# Patient Record
Sex: Female | Born: 1985 | Race: White | Hispanic: Yes
Health system: Southern US, Community
[De-identification: ages and names within clinical notes are randomized; demographics above are authoritative.]

---

## 2012-03-01 ENCOUNTER — Encounter: Payer: Self-pay | Admitting: Maternal and Fetal Medicine

## 2012-03-26 ENCOUNTER — Encounter: Payer: Self-pay | Admitting: Obstetrics and Gynecology

## 2012-06-04 ENCOUNTER — Encounter: Payer: Self-pay | Admitting: Maternal & Fetal Medicine

## 2012-08-06 ENCOUNTER — Encounter: Payer: Self-pay | Admitting: Obstetrics and Gynecology

## 2012-08-12 ENCOUNTER — Inpatient Hospital Stay: Payer: Self-pay

## 2012-08-12 LAB — CBC WITH DIFFERENTIAL/PLATELET
Basophil #: 0 10*3/uL (ref 0.0–0.1)
Basophil %: 0.3 %
Eosinophil #: 0 10*3/uL (ref 0.0–0.7)
Eosinophil %: 0.5 %
HGB: 12.6 g/dL (ref 12.0–16.0)
Lymphocyte %: 16.6 %
MCH: 32.4 pg (ref 26.0–34.0)
MCV: 92 fL (ref 80–100)
Monocyte #: 0.8 x10 3/mm (ref 0.2–0.9)
Neutrophil %: 74.2 %
Platelet: 176 10*3/uL (ref 150–440)
RBC: 3.9 10*6/uL (ref 3.80–5.20)
RDW: 13.6 % (ref 11.5–14.5)
WBC: 8.9 10*3/uL (ref 3.6–11.0)

## 2014-04-14 IMAGING — US US OB DETAIL+14 WK - NRPT [facility]
1 series · 14 of 28 positions shown · non-contrast
Comparison: none

[Series 1: us ob detail+14 wk - nrpt [facility] · 0.12mm/px · 14 of 61 slices shown]
[im 3/61]
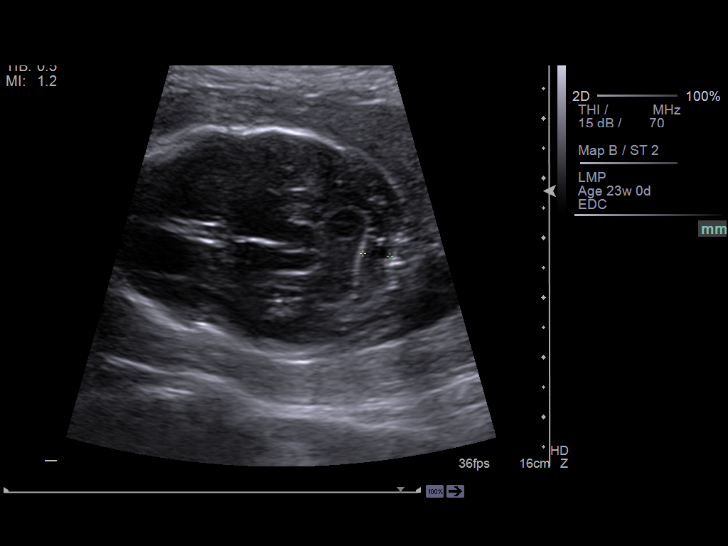
[im 7/61]
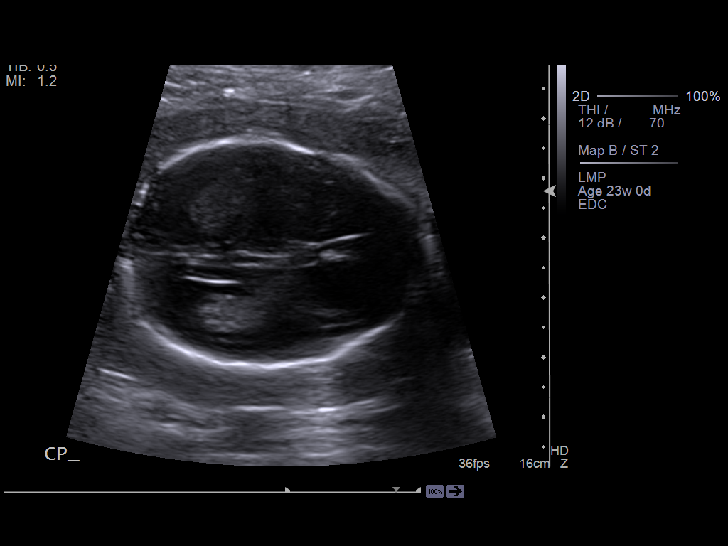
[im 12/61]
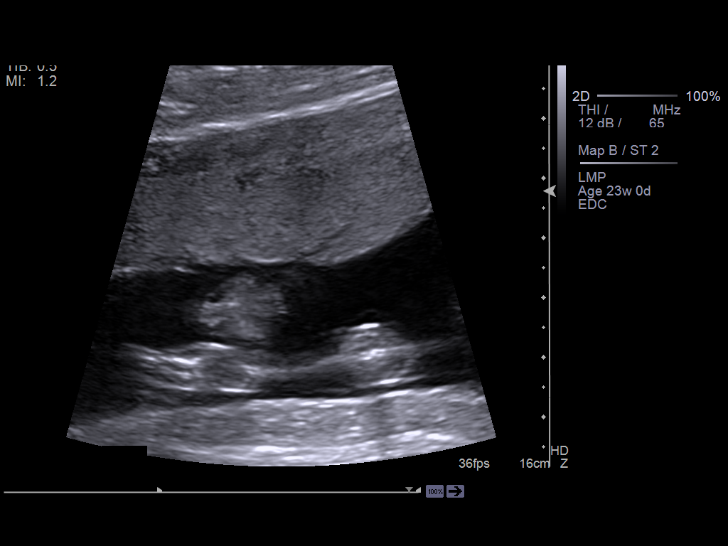
[im 16/61]
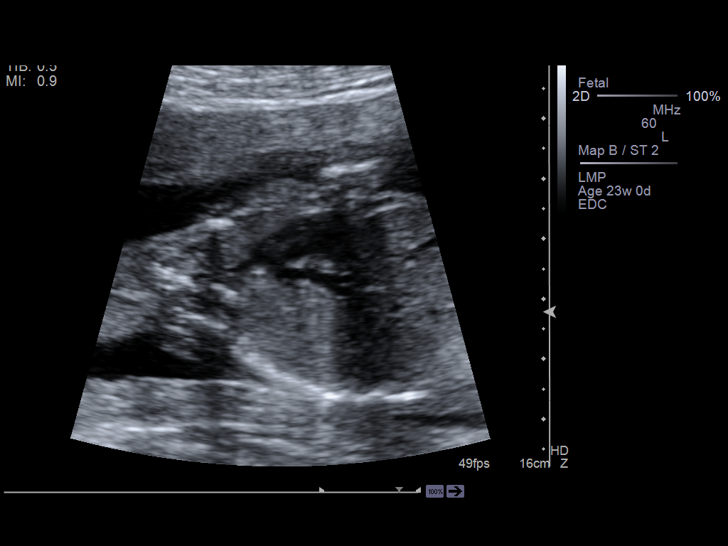
[im 21/61]
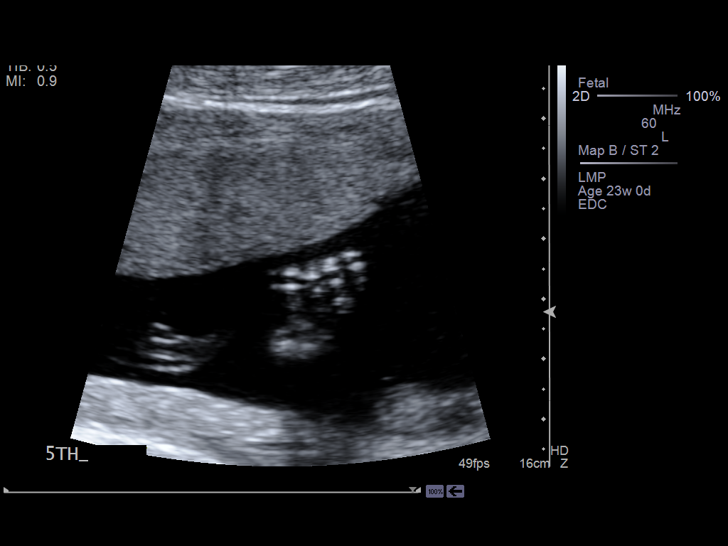
[im 25/61]
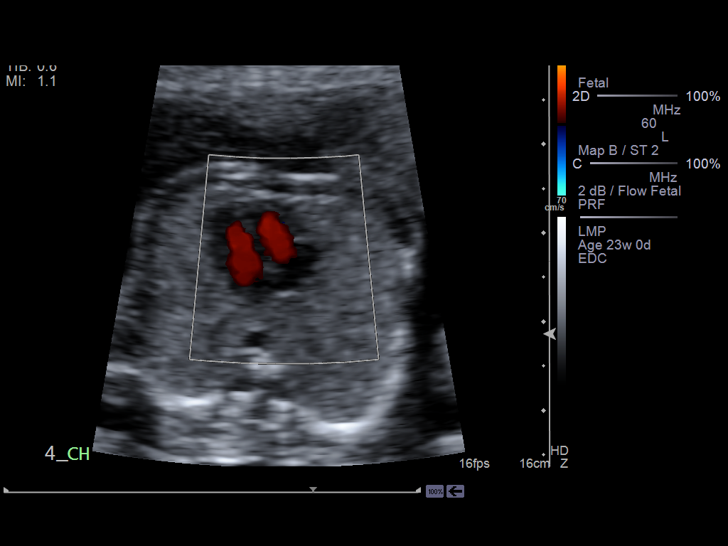
[im 29/61]
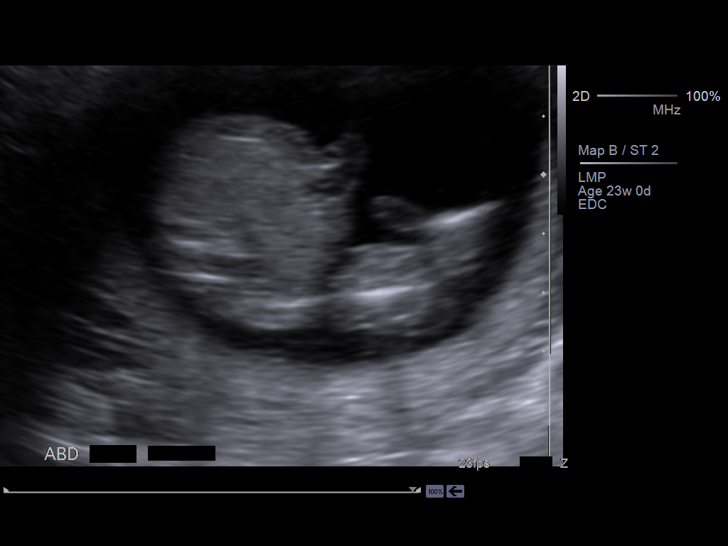
[im 34/61]
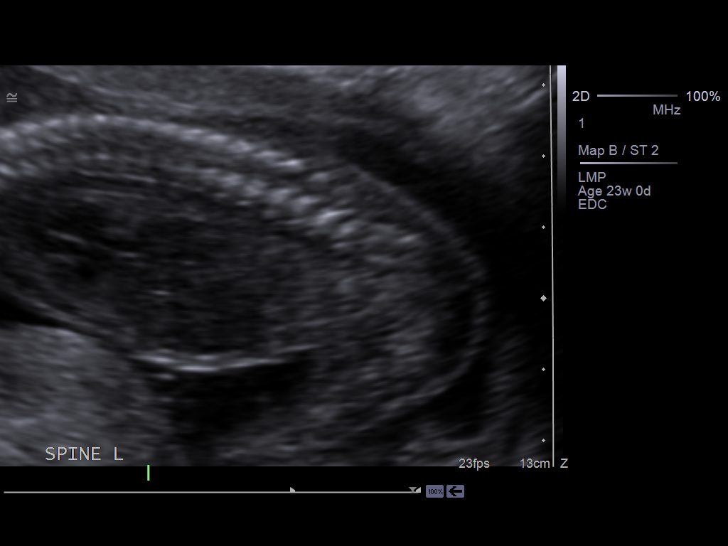
[im 38/61]
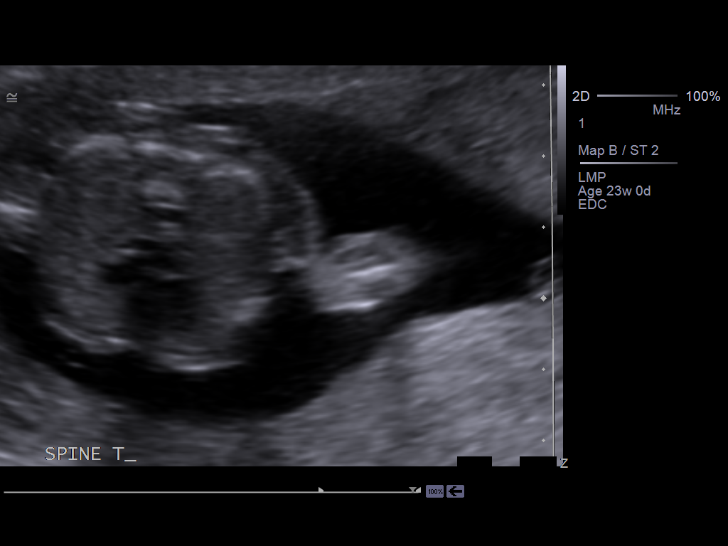
[im 43/61]
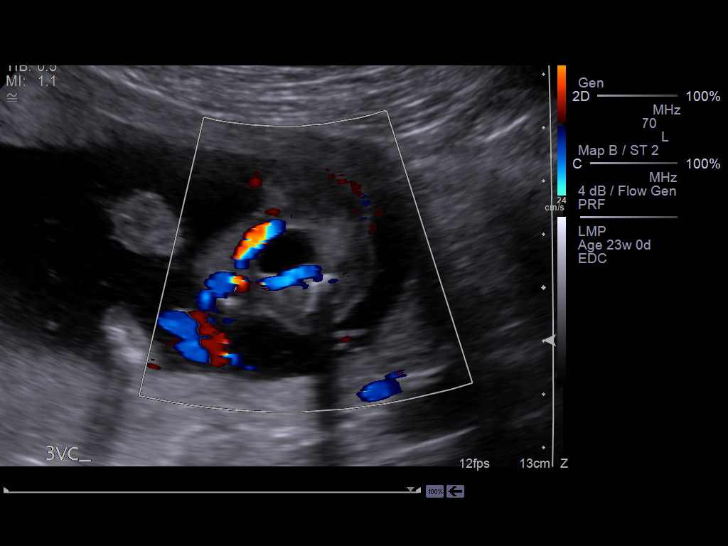
[im 47/61]
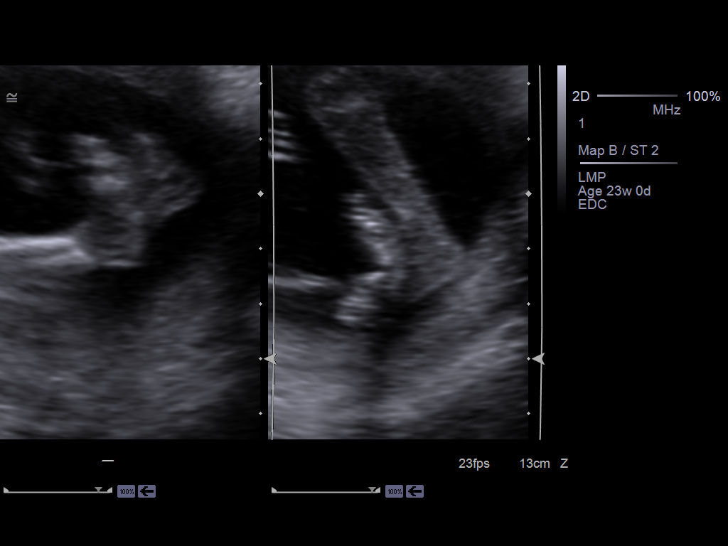
[im 52/61]
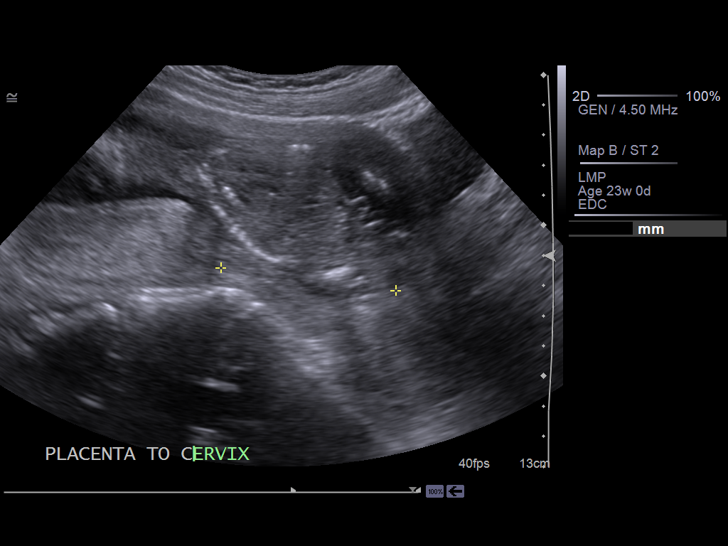
[im 56/61]
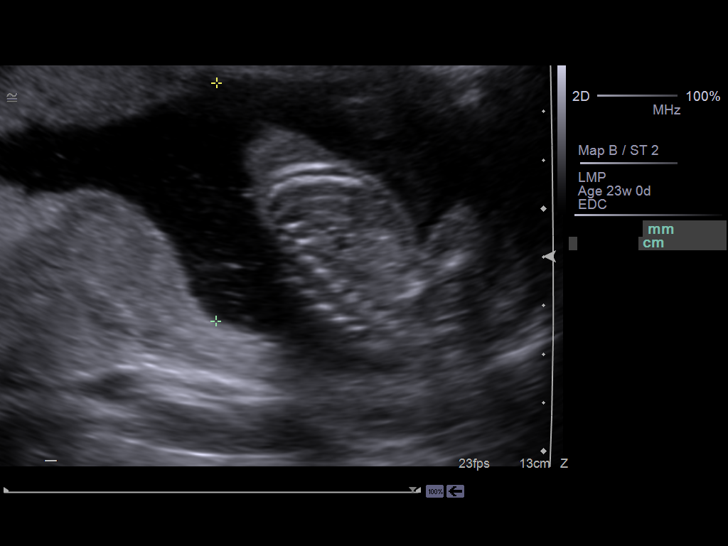
[im 61/61]
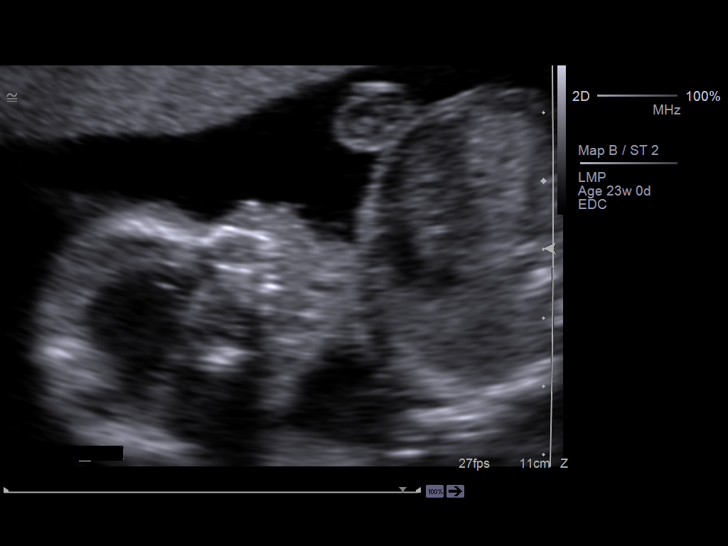

[14 of 28 positions shown; findings below may reference images not displayed]

IMAGES IMPORTED FROM THE [employer] WORKFLOW SYSTEM
NO DICTATION FOR STUDY

## 2014-08-25 IMAGING — US US OB FOLLOW-UP - [facility]PT [facility]
1 series · 14 of 28 positions shown · non-contrast
Comparison: none

[Series 1: us ob follow-up - [facility]pt [facility] · 0.23mm/px · 14 of 55 slices shown]
[im 3/55]
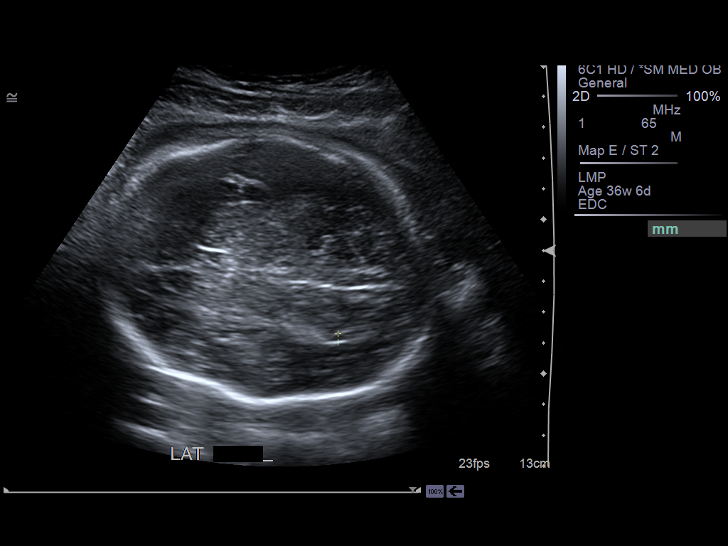
[im 7/55]
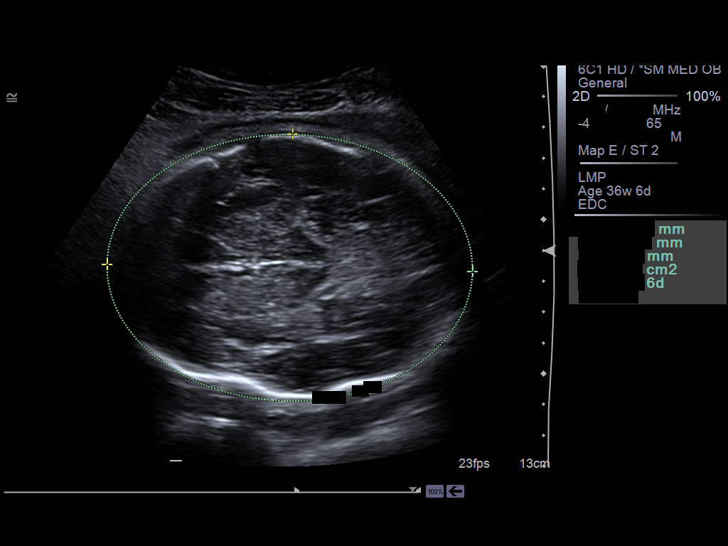
[im 11/55]
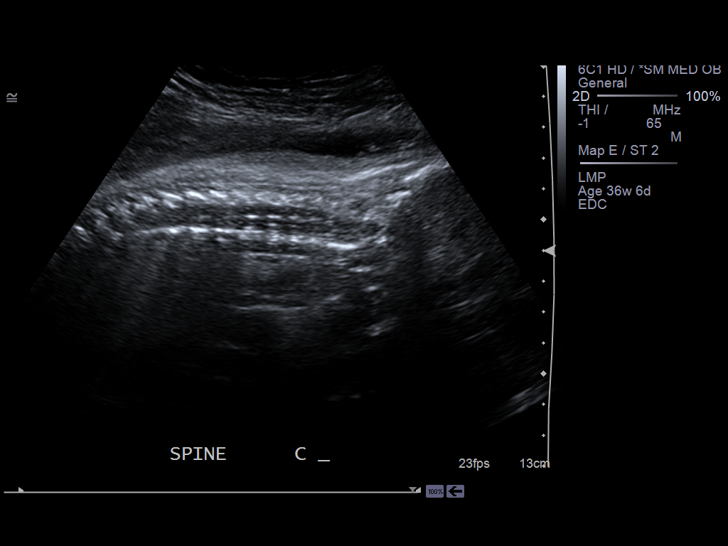
[im 15/55]
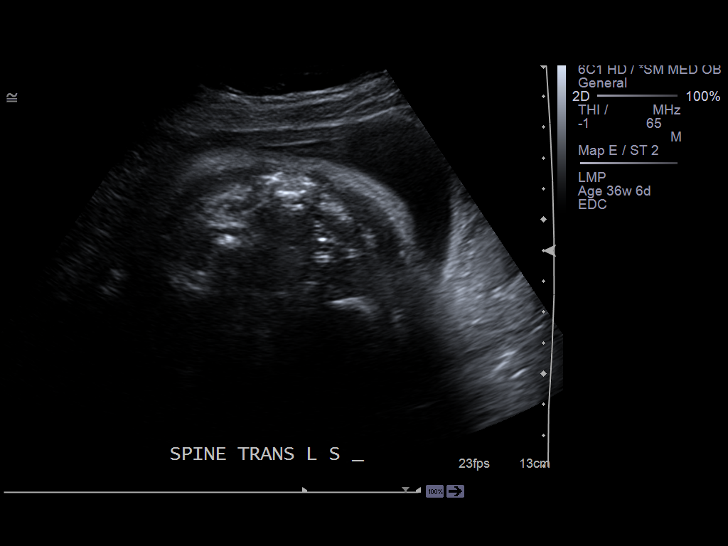
[im 19/55]
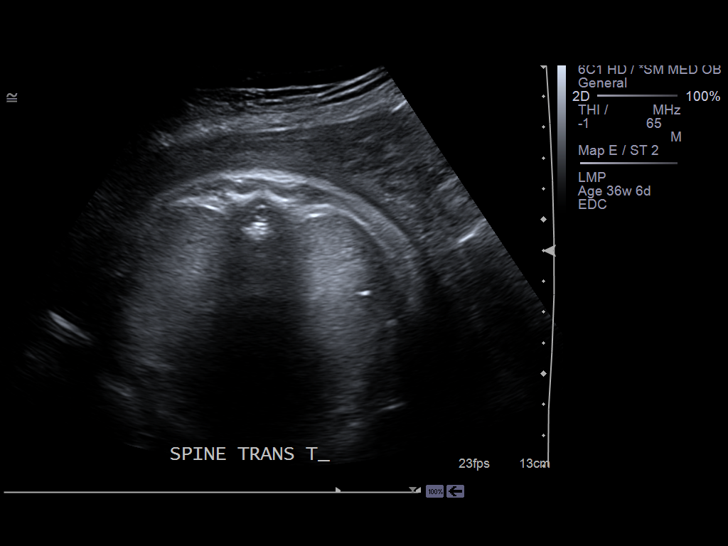
[im 23/55]
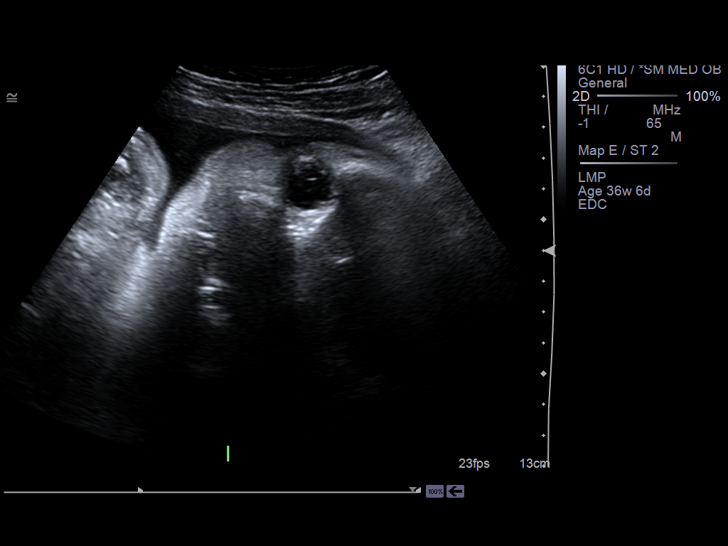
[im 27/55]
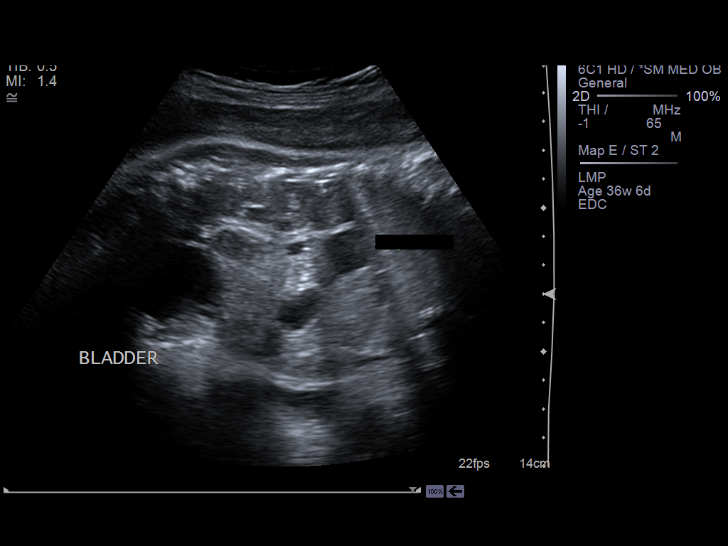
[im 31/55]
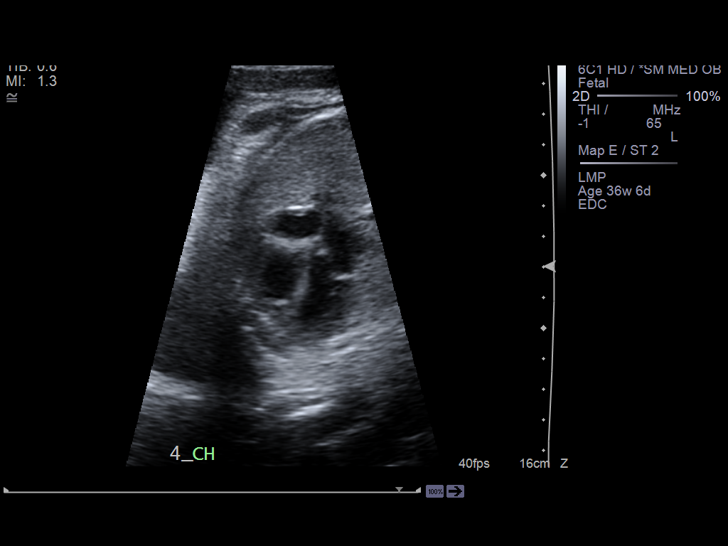
[im 35/55]
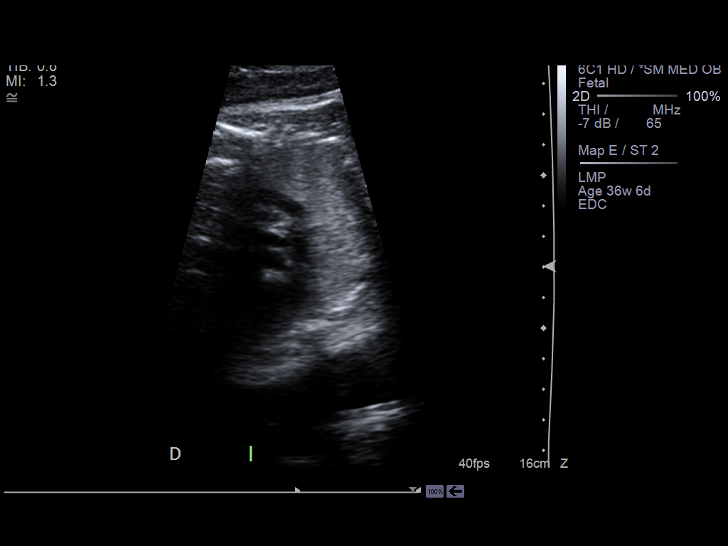
[im 39/55]
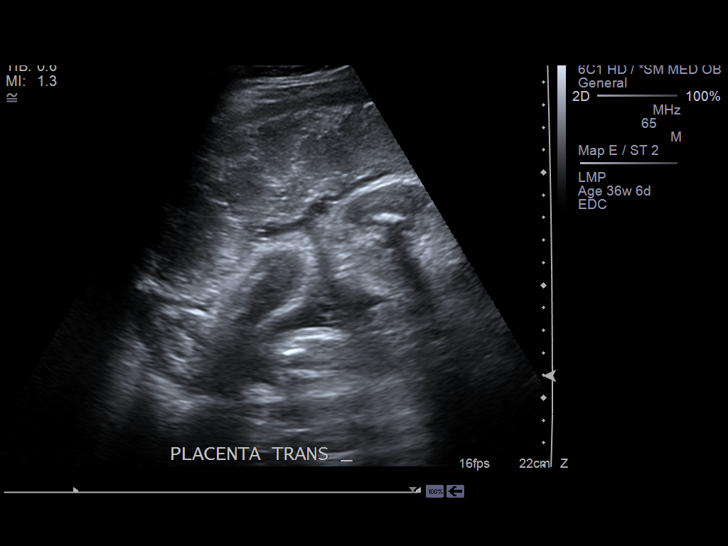
[im 43/55]
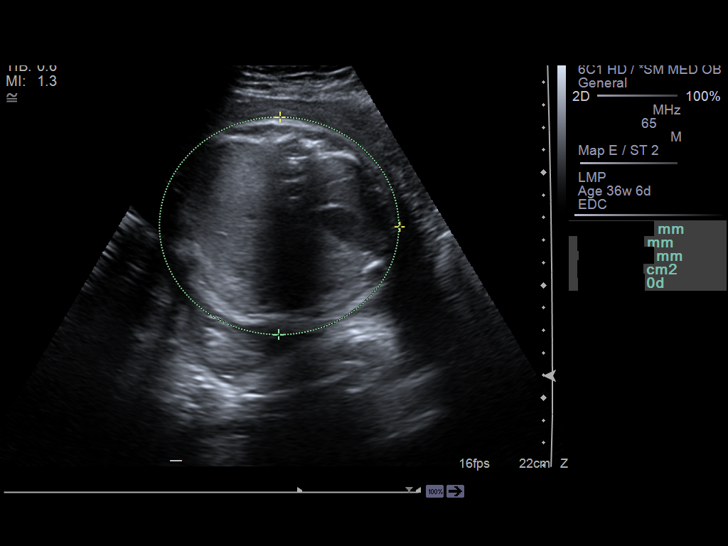
[im 47/55]
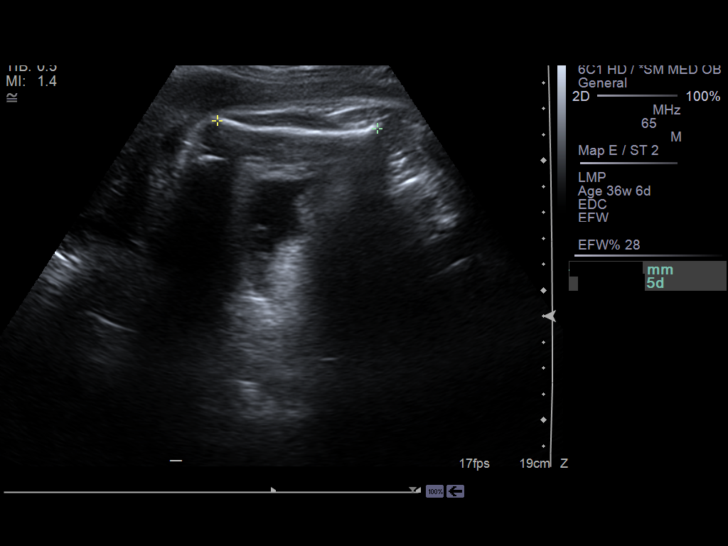
[im 51/55]
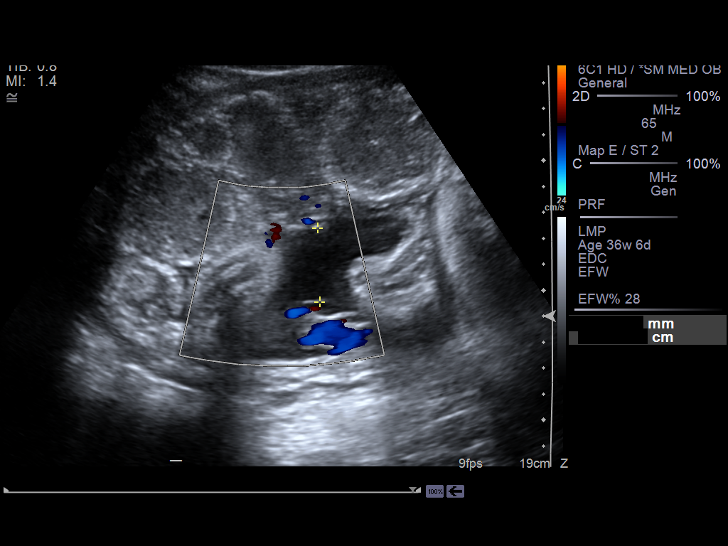
[im 55/55]
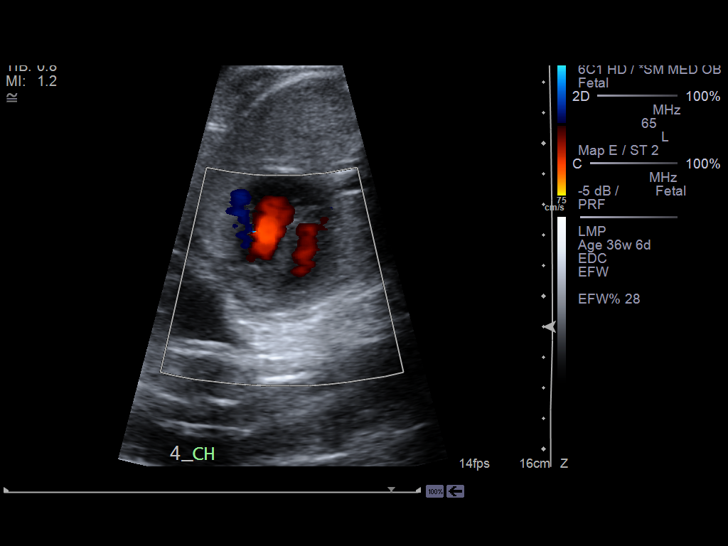

[14 of 28 positions shown; findings below may reference images not displayed]

IMAGES IMPORTED FROM THE [employer] WORKFLOW SYSTEM
NO DICTATION FOR STUDY

## 2014-12-30 NOTE — H&P (Signed)
L&D Evaluation:  History:   HPI 29 y/o G2P1001 @ 38wks EDC 08/28/12 arrives with c/o leaking fluid and regular contractions, small amount bloody show. Care @ [facility]. HX SGA infant 4#14 @ 38wks, GBS+    Presents with contractions, leaking fluid    Patient's Medical History No Chronic Illness    Patient's Surgical History none    Medications Pre Natal Vitamins    Allergies NKDA    Social History none    Family History Non-Contributory   ROS:   ROS All systems were reviewed.  HEENT, CNS, GI, GU, Respiratory, CV, Renal and Musculoskeletal systems were found to be normal.   Exam:   Vital Signs stable    Urine Protein not completed    General no apparent distress    Mental Status clear    Chest clear    Heart normal sinus rhythm    Abdomen gravid, non-tender    Estimated Fetal Weight Average for gestational age    Fetal Position vtx    Fundal Height term    Back no CVAT    Edema no edema    Reflexes 1+    Clonus negative    Pelvic no external lesions, 6cm per RNexam BBOW leaking clear nitrazine + fluid nl show    Description clear    FHT normal rate with no decels, baseline 130's 140's avg variability with accels    Fetal Heart Rate 132    Ucx irregular, EFM    Skin dry    Lymph no lymphadenopathy   Impression:   Impression active labor   Plan:   Plan monitor contractions and for cervical change, antibiotics for GBBS prophylaxis    Comments Admitted, knows what to expect with 2nd baby. Interpreter present. Explained pain management options available, declines for now..Uc's irregular, IV ABX begun.   Electronic Signatures: Albertina ParrLugiano, Nicki Furlan B (CNM)  (Signed 22-Dec-13 17:20)  Authored: L&D Evaluation   Last Updated: 22-Dec-13 17:20 by Albertina ParrLugiano, Kerby Hockley B (CNM)
